# Patient Record
Sex: Female | Born: 1970 | Race: White | Hispanic: No | Marital: Married | Smoking: Never smoker
Health system: Southern US, Community
[De-identification: ages and names within clinical notes are randomized; demographics above are authoritative.]

## PROBLEM LIST (undated history)

## (undated) ENCOUNTER — Emergency Department (HOSPITAL_COMMUNITY): Admission: EM | Discharge status: Against Medical Advice | Payer: Self-pay

---

## 1997-11-10 ENCOUNTER — Other Ambulatory Visit: Admission: RE | Admit: 1997-11-10 | Discharge: 1997-11-10 | Payer: Self-pay | Admitting: *Deleted

## 1998-06-09 ENCOUNTER — Inpatient Hospital Stay (HOSPITAL_COMMUNITY): Admission: AD | Admit: 1998-06-09 | Discharge: 1998-06-09 | Payer: Self-pay | Admitting: Obstetrics and Gynecology

## 1998-06-13 ENCOUNTER — Inpatient Hospital Stay (HOSPITAL_COMMUNITY): Admission: AD | Admit: 1998-06-13 | Discharge: 1998-06-15 | Payer: Self-pay | Admitting: Obstetrics and Gynecology

## 1998-07-21 ENCOUNTER — Other Ambulatory Visit: Admission: RE | Admit: 1998-07-21 | Discharge: 1998-07-21 | Payer: Self-pay | Admitting: Obstetrics and Gynecology

## 1998-11-19 ENCOUNTER — Other Ambulatory Visit: Admission: RE | Admit: 1998-11-19 | Discharge: 1998-11-19 | Payer: Self-pay | Admitting: Obstetrics and Gynecology

## 1999-06-22 ENCOUNTER — Ambulatory Visit (HOSPITAL_COMMUNITY): Admission: RE | Admit: 1999-06-22 | Discharge: 1999-06-22 | Payer: Self-pay | Admitting: Gastroenterology

## 1999-07-28 ENCOUNTER — Other Ambulatory Visit: Admission: RE | Admit: 1999-07-28 | Discharge: 1999-07-28 | Payer: Self-pay | Admitting: Obstetrics and Gynecology

## 2000-08-15 ENCOUNTER — Other Ambulatory Visit: Admission: RE | Admit: 2000-08-15 | Discharge: 2000-08-15 | Payer: Self-pay | Admitting: Obstetrics and Gynecology

## 2001-07-16 ENCOUNTER — Inpatient Hospital Stay (HOSPITAL_COMMUNITY): Admission: AD | Admit: 2001-07-16 | Discharge: 2001-07-18 | Payer: Self-pay | Admitting: Obstetrics and Gynecology

## 2001-07-19 ENCOUNTER — Encounter: Admission: RE | Admit: 2001-07-19 | Discharge: 2001-08-18 | Payer: Self-pay | Admitting: Obstetrics and Gynecology

## 2001-08-23 ENCOUNTER — Other Ambulatory Visit: Admission: RE | Admit: 2001-08-23 | Discharge: 2001-08-23 | Payer: Self-pay | Admitting: Obstetrics and Gynecology

## 2002-09-16 ENCOUNTER — Other Ambulatory Visit: Admission: RE | Admit: 2002-09-16 | Discharge: 2002-09-16 | Payer: Self-pay | Admitting: Obstetrics and Gynecology

## 2003-03-08 ENCOUNTER — Inpatient Hospital Stay (HOSPITAL_COMMUNITY): Admission: AD | Admit: 2003-03-08 | Discharge: 2003-03-08 | Payer: Self-pay | Admitting: Obstetrics and Gynecology

## 2003-03-28 ENCOUNTER — Inpatient Hospital Stay (HOSPITAL_COMMUNITY): Admission: AD | Admit: 2003-03-28 | Discharge: 2003-03-30 | Payer: Self-pay | Admitting: Obstetrics and Gynecology

## 2003-05-06 ENCOUNTER — Other Ambulatory Visit: Admission: RE | Admit: 2003-05-06 | Discharge: 2003-05-06 | Payer: Self-pay | Admitting: Obstetrics and Gynecology

## 2004-05-25 ENCOUNTER — Other Ambulatory Visit: Admission: RE | Admit: 2004-05-25 | Discharge: 2004-05-25 | Payer: Self-pay | Admitting: Obstetrics and Gynecology

## 2005-06-14 ENCOUNTER — Other Ambulatory Visit: Admission: RE | Admit: 2005-06-14 | Discharge: 2005-06-14 | Payer: Self-pay | Admitting: Obstetrics and Gynecology

## 2014-01-10 ENCOUNTER — Telehealth: Payer: Self-pay | Admitting: Neurology

## 2014-01-10 NOTE — Telephone Encounter (Signed)
error °

## 2015-09-15 ENCOUNTER — Other Ambulatory Visit: Payer: Self-pay | Admitting: Obstetrics and Gynecology

## 2015-09-15 DIAGNOSIS — R928 Other abnormal and inconclusive findings on diagnostic imaging of breast: Secondary | ICD-10-CM

## 2015-09-17 ENCOUNTER — Ambulatory Visit
Admission: RE | Admit: 2015-09-17 | Discharge: 2015-09-17 | Disposition: A | Discharge status: Home / Self Care | Payer: Managed Care, Other (non HMO) | Source: Ambulatory Visit | Attending: Obstetrics and Gynecology | Admitting: Obstetrics and Gynecology

## 2015-09-17 DIAGNOSIS — R928 Other abnormal and inconclusive findings on diagnostic imaging of breast: Secondary | ICD-10-CM

## 2015-09-23 ENCOUNTER — Other Ambulatory Visit: Payer: Self-pay

## 2016-08-09 ENCOUNTER — Other Ambulatory Visit: Payer: Self-pay | Admitting: Obstetrics and Gynecology

## 2016-08-09 DIAGNOSIS — Z1231 Encounter for screening mammogram for malignant neoplasm of breast: Secondary | ICD-10-CM

## 2016-09-19 ENCOUNTER — Ambulatory Visit
Admission: RE | Admit: 2016-09-19 | Discharge: 2016-09-19 | Disposition: A | Discharge status: Home / Self Care | Payer: Managed Care, Other (non HMO) | Source: Ambulatory Visit | Attending: Obstetrics and Gynecology | Admitting: Obstetrics and Gynecology

## 2016-09-19 DIAGNOSIS — Z1231 Encounter for screening mammogram for malignant neoplasm of breast: Secondary | ICD-10-CM

## 2016-09-20 ENCOUNTER — Other Ambulatory Visit: Payer: Self-pay | Admitting: Obstetrics and Gynecology

## 2016-09-20 DIAGNOSIS — R928 Other abnormal and inconclusive findings on diagnostic imaging of breast: Secondary | ICD-10-CM

## 2016-09-23 ENCOUNTER — Ambulatory Visit
Admission: RE | Admit: 2016-09-23 | Discharge: 2016-09-23 | Disposition: A | Discharge status: Home / Self Care | Payer: Managed Care, Other (non HMO) | Source: Ambulatory Visit | Attending: Obstetrics and Gynecology | Admitting: Obstetrics and Gynecology

## 2016-09-23 DIAGNOSIS — R928 Other abnormal and inconclusive findings on diagnostic imaging of breast: Secondary | ICD-10-CM

## 2017-01-25 ENCOUNTER — Encounter (INDEPENDENT_AMBULATORY_CARE_PROVIDER_SITE_OTHER): Payer: Self-pay | Admitting: Physician Assistant

## 2017-01-25 ENCOUNTER — Ambulatory Visit (INDEPENDENT_AMBULATORY_CARE_PROVIDER_SITE_OTHER): Payer: Managed Care, Other (non HMO)

## 2017-01-25 ENCOUNTER — Ambulatory Visit (INDEPENDENT_AMBULATORY_CARE_PROVIDER_SITE_OTHER): Payer: Managed Care, Other (non HMO) | Admitting: Physician Assistant

## 2017-01-25 DIAGNOSIS — M7062 Trochanteric bursitis, left hip: Secondary | ICD-10-CM | POA: Diagnosis not present

## 2017-01-25 MED ORDER — LIDOCAINE HCL 1 % IJ SOLN
3.0000 mL | INTRAMUSCULAR | Status: AC | PRN
  Administered 2017-01-25: 3 mL

## 2017-01-25 MED ORDER — METHYLPREDNISOLONE ACETATE 40 MG/ML IJ SUSP
40.0000 mg | INTRAMUSCULAR | Status: AC | PRN
  Administered 2017-01-25: 40 mg via INTRA_ARTICULAR

## 2017-01-25 NOTE — Progress Notes (Signed)
Office Visit Note   Patient: Bonnie Perez           Date of Birth: 12/05/1970           MRN: [id number] Visit Date: 01/25/2017              Requested by: Merri BrunetteSmith, Candace, MD [phone] [address] [place], [place] [id number] PCP: Merri BrunetteSmith, Candace, MD   Assessment & Plan: Visit Diagnoses:  1. Trochanteric bursitis, left hip     Plan: IT band stretching exercises are shown.  She can discuss some other exercises with her friend who is a physical therapist.  She will follow-up with us on an as-needed basis pain persist or becomes worse.  Follow-Up Instructions: Return if symptoms worsen or fail to improve.   Orders:  Orders Placed This Encounter  Procedures  . XR HIP UNILAT W OR W/O PELVIS 1V LEFT   No orders of the defined types were placed in this encounter.     Procedures: Large Joint Inj: L greater trochanter on 01/25/2017 11:27 AM Indications: pain Details: 22 G 1.5 in needle, lateral approach  Arthrogram: No  Medications: 3 mL lidocaine 1 %; 40 mg methylPREDNISolone acetate 40 MG/ML Outcome: tolerated well, no immediate complications Procedure, treatment alternatives, risks and benefits explained, specific risks discussed. Consent was given by the patient. Immediately prior to procedure a time out was called to verify the correct patient, procedure, equipment, support staff and site/side marked as required. Patient was prepped and draped in the usual sterile fashion.       Clinical Data: No additional findings.   Subjective: Chief Complaint  Patient presents with  . Left Hip - Follow-up    HPI Mrs. Bonnie Perez is a 46 year old female comes in today with lateral left hip pain for at least a month maybe longer.  Pain is worse with weightbearing.  Also with lying on her left hip at night.  No waking pain.  She denies any radicular symptoms down either leg.  No back pain.  She has had no injury.  She does run about 5 miles 3 times a week.  She does step  classes also.  She states that the pain in the lateral aspect of the hip is keeping her from doing what she wants to do.  She is talked to a friend who is a physical therapist who recommended she be seen for this.  She is tried some over-the-counter anti-inflammatories without any real results.   Review of Systems Please see HPI otherwise negative  Objective: Vital Signs: There were no vitals taken for this visit.  Physical Exam  Constitutional: She is oriented to person, place, and time. She appears well-developed and well-nourished. No distress.  Pulmonary/Chest: Effort normal.  Neurological: She is alert and oriented to person, place, and time.  Skin: She is not diaphoretic.  Psychiatric: She has a normal mood and affect.    Ortho Exam Bilateral hips good range of motion with internal and external rotation.  Discomfort left hip lateral aspect with extreme internal rotation.  Negative straight leg raise bilaterally.  She has tenderness over left trochanteric region.  Specialty Comments:  No specialty comments available.  Imaging: Xr Hip Unilat W Or W/o Pelvis 1v Left  Result Date: 01/25/2017 Radiographs AP pelvis and lateral view left hip: No acute fractures.  Bilateral hips well maintained.  Hips are well located.  No other bony abnormalities seen.    PMFS History: There are no active problems to display  for this patient.  No past medical history on file.  Family History  Problem Relation Age of Onset  . Breast cancer Maternal Aunt     No past surgical history on file. Social History   Occupational History  . Not on file  Tobacco Use  . Smoking status: Not on file  Substance and Sexual Activity  . Alcohol use: Not on file  . Drug use: Not on file  . Sexual activity: Not on file

## 2017-04-27 ENCOUNTER — Ambulatory Visit (INDEPENDENT_AMBULATORY_CARE_PROVIDER_SITE_OTHER): Payer: Managed Care, Other (non HMO) | Admitting: Sports Medicine

## 2017-04-27 VITALS — BP 135/64 | Ht 65.5 in | Wt 128.0 lb

## 2017-04-27 DIAGNOSIS — M21619 Bunion of unspecified foot: Secondary | ICD-10-CM

## 2017-04-28 ENCOUNTER — Encounter: Payer: Self-pay | Admitting: Sports Medicine

## 2017-04-28 NOTE — Progress Notes (Signed)
   Subjective:    Patient ID: Bonnie LambKathryn Sandberg, female    DOB: [birth year], 47 y.o.   MRN: [id number]  HPI chief complaint: Left foot pain  Olegario MessierKathy is a very pleasant 47 year old female who comes in today complaining of 2 weeks of left foot pain that she localizes to the medial most aspect of her MTP joint. She is currently training for her first half marathon and began to experience pain without any known trauma. Despite her pain, she has been able to continue training. She recently purchased a pair of tennis shoes with a wider toe box and that has decreased her pain. She has not noticed any swelling. She denies pain elsewhere in the foot. She does have a history of 2 previous metatarsal stress fractures but her current pain is different in nature than what she experienced with those injuries. Her pain is most noticeable with direct pressure over the area. She denies erythema. No numbness or tingling. No pain at rest.  Past medical history reviewed Medications reviewed Allergies reviewed    Review of Systems As above    Objective:   Physical Exam  Well-developed, well-nourished. No acute distress. Awake alert and oriented 3. Vital signs reviewed  Left foot: Patient has a moderate bunion deformity. She is tender to palpation over the medial most aspect of the MTP joint and there is a very mild amount of swelling here. No erythema. No pain with active or passive MTP range of motion. No hallux rigidus appreciated. No tenderness to palpation over the sesamoids. No tenderness to palpation across the dorsum of the foot. Negative metatarsal squeeze. No soft tissue swelling on the dorsum of the foot. Good pulses. Sensation is intact to light-touch distally. Patient has a cavus foot with callus formation over the plantar aspect of the first MTP and fifth MTP. 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      Assessment & Plan:   Left foot pain likely secondary to medial first MTP  bursitis Bunion deformity Cavus foot  I think the wider toe box will help her tremendously. I've also recommended that she purchase a bunion sock to help protect her MTP from friction when running. Since she is 3 weeks away from half marathon I do not want to do any sort of orthotic. But, she may benefit from a metatarsal pad at some point down the road. She may also benefit from a custom orthotic given her cavus foot and previous stress fractures. She will follow-up with me at some point after her half marathon to discuss these options further. She is reassured that I do not believe this to be a stress fracture and she may continue to train for her half marathon using pain as her guide.

## 2017-05-23 ENCOUNTER — Encounter: Payer: Self-pay | Admitting: Sports Medicine

## 2017-05-23 ENCOUNTER — Ambulatory Visit (INDEPENDENT_AMBULATORY_CARE_PROVIDER_SITE_OTHER): Payer: Managed Care, Other (non HMO) | Admitting: Sports Medicine

## 2017-05-23 VITALS — BP 114/68 | Ht 65.5 in | Wt 128.0 lb

## 2017-05-23 DIAGNOSIS — Q667 Congenital pes cavus, unspecified foot: Secondary | ICD-10-CM

## 2017-05-23 NOTE — Progress Notes (Signed)
   Subjective:    Patient ID: Bonnie LambKathryn Perez, female    DOB: [birth year], 47 y.o.   MRN: [id number]  HPI   Patient comes in today for follow-up on left foot pain. She was able to complete her half marathon and is happy with her time. She has found the bunion socks to be helpful. She now has minimal pain at the medial aspect of the first MTP joint. She is here today to discuss possible custom orthotics.   Review of Systems As above    Objective:   Physical Exam  Well-developed, well-nourished. No acute distress. Awake alert and oriented 3. Vital signs reviewed  Left foot: Moderate bunion deformity once again seen. Wide forefoot and narrow hindfoot. She is tender to palpation of the medial most aspect of the MTP joint but the previous swelling has resolved. No erythema. Negative metatarsal squeeze. Cavus foot. Neurovascularly intact distally.      Assessment & Plan:   Bunion deformity, left foot Cavus foot History of metatarsal stress fractures  I think the patient would benefit from custom orthotics. Those were constructed for her today. I initially tried placing a metatarsal pad to help give her transverse arch some support but this caused more pain at her bunion. After removing the pad and aggressively grinding down the blue EVA she found the orthotic to be much more comfortable. She will try running with these but if she finds them to be uncomfortable she may discontinue them altogether and return to her regular inserts. Total time spent with the patient today was 30 minutes with greater than 50% of the time spent in face-to-face consultation discussing orthotic construction, instruction, and fitting. Follow-up as needed.  Patient was fitted for a : standard, cushioned, semi-rigid orthotic. The orthotic was heated and afterward the patient stood on the orthotic blank positioned on the orthotic stand. The patient was positioned in subtalar neutral position and 10 degrees of ankle  dorsiflexion in a weight bearing stance. After completion of molding, a stable base was applied to the orthotic blank. The blank was ground to a stable position for weight bearing. Size: 6 Base: Blue EVA Posting: none Additional orthotic padding: none

## 2017-08-29 ENCOUNTER — Other Ambulatory Visit: Payer: Self-pay | Admitting: Family Medicine

## 2017-08-29 DIAGNOSIS — N63 Unspecified lump in unspecified breast: Secondary | ICD-10-CM

## 2017-09-01 ENCOUNTER — Other Ambulatory Visit: Payer: Managed Care, Other (non HMO)

## 2017-09-04 ENCOUNTER — Ambulatory Visit
Admission: RE | Admit: 2017-09-04 | Discharge: 2017-09-04 | Disposition: A | Discharge status: Home / Self Care | Payer: Managed Care, Other (non HMO) | Source: Ambulatory Visit | Attending: Family Medicine | Admitting: Family Medicine

## 2017-09-04 DIAGNOSIS — N63 Unspecified lump in unspecified breast: Secondary | ICD-10-CM

## 2018-07-23 ENCOUNTER — Other Ambulatory Visit: Payer: Self-pay | Admitting: Obstetrics & Gynecology

## 2018-07-23 DIAGNOSIS — Z1231 Encounter for screening mammogram for malignant neoplasm of breast: Secondary | ICD-10-CM

## 2018-09-14 ENCOUNTER — Other Ambulatory Visit: Payer: Self-pay

## 2018-09-14 ENCOUNTER — Ambulatory Visit
Admission: RE | Admit: 2018-09-14 | Discharge: 2018-09-14 | Disposition: A | Discharge status: Home / Self Care | Payer: 59 | Source: Ambulatory Visit | Attending: Obstetrics & Gynecology | Admitting: Obstetrics & Gynecology

## 2018-09-14 DIAGNOSIS — Z1231 Encounter for screening mammogram for malignant neoplasm of breast: Secondary | ICD-10-CM

## 2018-09-18 ENCOUNTER — Other Ambulatory Visit: Payer: Self-pay | Admitting: Obstetrics & Gynecology

## 2018-09-18 DIAGNOSIS — R928 Other abnormal and inconclusive findings on diagnostic imaging of breast: Secondary | ICD-10-CM

## 2018-09-20 ENCOUNTER — Ambulatory Visit
Admission: RE | Admit: 2018-09-20 | Discharge: 2018-09-20 | Disposition: A | Discharge status: Home / Self Care | Payer: 59 | Source: Ambulatory Visit | Attending: Obstetrics & Gynecology | Admitting: Obstetrics & Gynecology

## 2018-09-20 ENCOUNTER — Other Ambulatory Visit: Payer: Self-pay

## 2018-09-20 ENCOUNTER — Other Ambulatory Visit: Payer: Self-pay | Admitting: Obstetrics & Gynecology

## 2018-09-20 DIAGNOSIS — R928 Other abnormal and inconclusive findings on diagnostic imaging of breast: Secondary | ICD-10-CM

## 2018-09-20 DIAGNOSIS — R921 Mammographic calcification found on diagnostic imaging of breast: Secondary | ICD-10-CM

## 2018-09-25 ENCOUNTER — Other Ambulatory Visit: Payer: Self-pay

## 2018-09-25 ENCOUNTER — Ambulatory Visit
Admission: RE | Admit: 2018-09-25 | Discharge: 2018-09-25 | Disposition: A | Discharge status: Home / Self Care | Payer: 59 | Source: Ambulatory Visit | Attending: Obstetrics & Gynecology | Admitting: Obstetrics & Gynecology

## 2018-09-25 DIAGNOSIS — R921 Mammographic calcification found on diagnostic imaging of breast: Secondary | ICD-10-CM

## 2018-09-25 HISTORY — PX: BREAST BIOPSY: SHX20

## 2019-02-02 ENCOUNTER — Other Ambulatory Visit: Payer: Self-pay

## 2019-02-02 DIAGNOSIS — Z20822 Contact with and (suspected) exposure to covid-19: Secondary | ICD-10-CM

## 2019-02-05 LAB — NOVEL CORONAVIRUS, NAA: SARS-CoV-2, NAA: NOT DETECTED

## 2019-04-02 ENCOUNTER — Ambulatory Visit: Payer: 59 | Attending: Internal Medicine

## 2019-04-02 DIAGNOSIS — Z20822 Contact with and (suspected) exposure to covid-19: Secondary | ICD-10-CM

## 2019-04-04 LAB — NOVEL CORONAVIRUS, NAA: SARS-CoV-2, NAA: NOT DETECTED

## 2019-07-24 ENCOUNTER — Other Ambulatory Visit: Payer: Self-pay | Admitting: Obstetrics & Gynecology

## 2019-07-24 DIAGNOSIS — Z1231 Encounter for screening mammogram for malignant neoplasm of breast: Secondary | ICD-10-CM

## 2019-09-17 ENCOUNTER — Other Ambulatory Visit: Payer: Self-pay

## 2019-09-17 ENCOUNTER — Ambulatory Visit
Admission: RE | Admit: 2019-09-17 | Discharge: 2019-09-17 | Disposition: A | Discharge status: Home / Self Care | Payer: BC Managed Care – PPO | Source: Ambulatory Visit | Attending: Obstetrics & Gynecology | Admitting: Obstetrics & Gynecology

## 2019-09-17 DIAGNOSIS — Z1231 Encounter for screening mammogram for malignant neoplasm of breast: Secondary | ICD-10-CM | POA: Diagnosis not present

## 2019-12-03 DIAGNOSIS — D225 Melanocytic nevi of trunk: Secondary | ICD-10-CM | POA: Diagnosis not present

## 2019-12-03 DIAGNOSIS — L821 Other seborrheic keratosis: Secondary | ICD-10-CM | POA: Diagnosis not present

## 2019-12-03 DIAGNOSIS — D2261 Melanocytic nevi of right upper limb, including shoulder: Secondary | ICD-10-CM | POA: Diagnosis not present

## 2019-12-03 DIAGNOSIS — L814 Other melanin hyperpigmentation: Secondary | ICD-10-CM | POA: Diagnosis not present

## 2019-12-10 DIAGNOSIS — Z1159 Encounter for screening for other viral diseases: Secondary | ICD-10-CM | POA: Diagnosis not present

## 2019-12-10 DIAGNOSIS — Z0000 Encounter for general adult medical examination without abnormal findings: Secondary | ICD-10-CM | POA: Diagnosis not present

## 2019-12-17 DIAGNOSIS — Z23 Encounter for immunization: Secondary | ICD-10-CM | POA: Diagnosis not present

## 2019-12-17 DIAGNOSIS — Z0000 Encounter for general adult medical examination without abnormal findings: Secondary | ICD-10-CM | POA: Diagnosis not present

## 2019-12-17 DIAGNOSIS — Z1211 Encounter for screening for malignant neoplasm of colon: Secondary | ICD-10-CM | POA: Diagnosis not present

## 2020-03-14 IMAGING — MG STEREOTACTIC CORE NEEDLE BIOPSY
8 of 11 series · 8 of 23 positions shown · non-contrast
Comparison: Previous exams.
COMPARISON: Previous exams.

Addendum:
CLINICAL DATA: 48-year-old female presenting for stereotactic
biopsy of left breast calcifications.

EXAM:
LEFT BREAST STEREOTACTIC CORE NEEDLE BIOPSY

[L (1 of 6)]
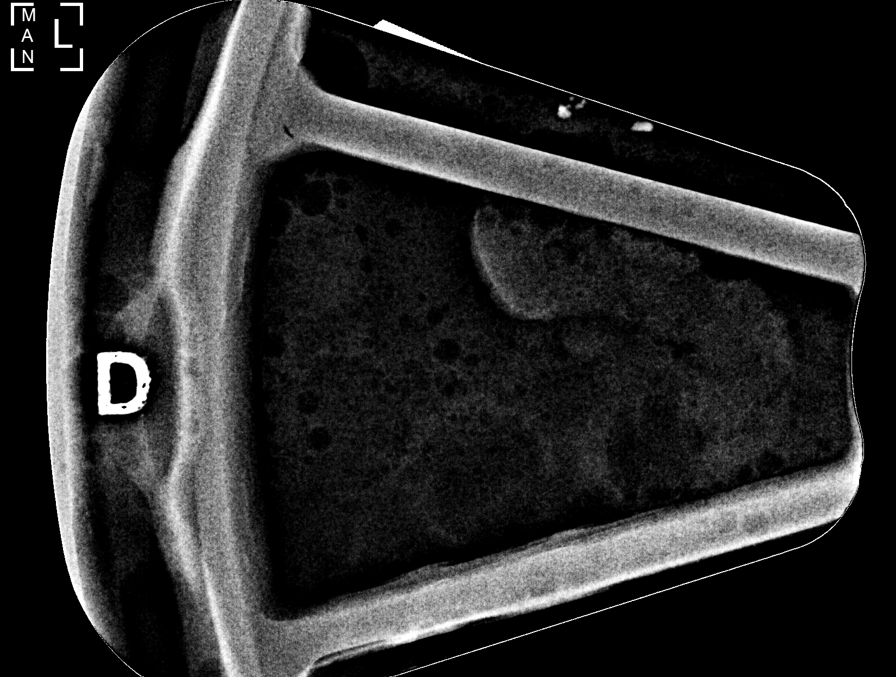

[L (2 of 6)]
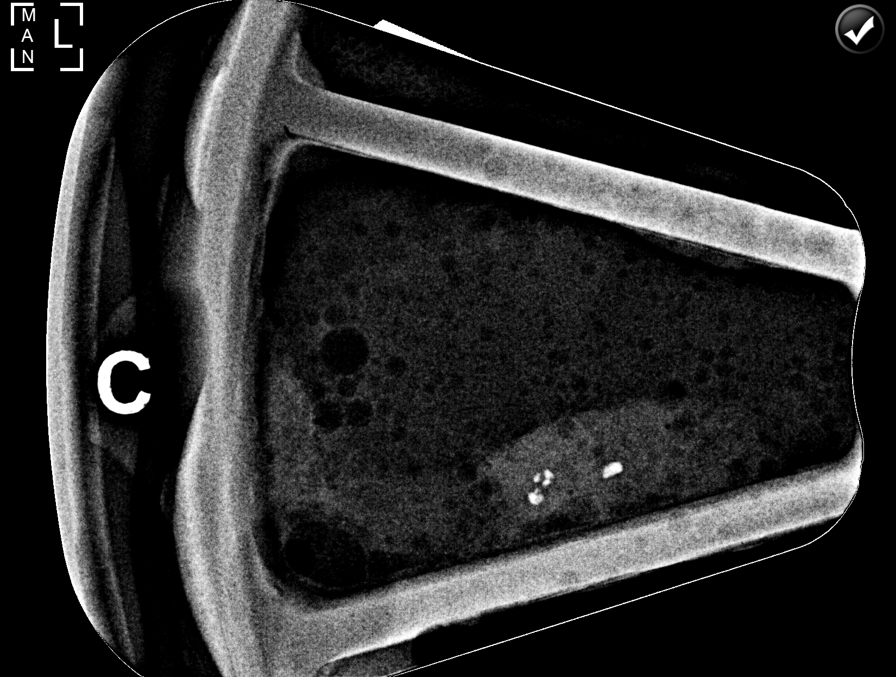

[L (3 of 6)]
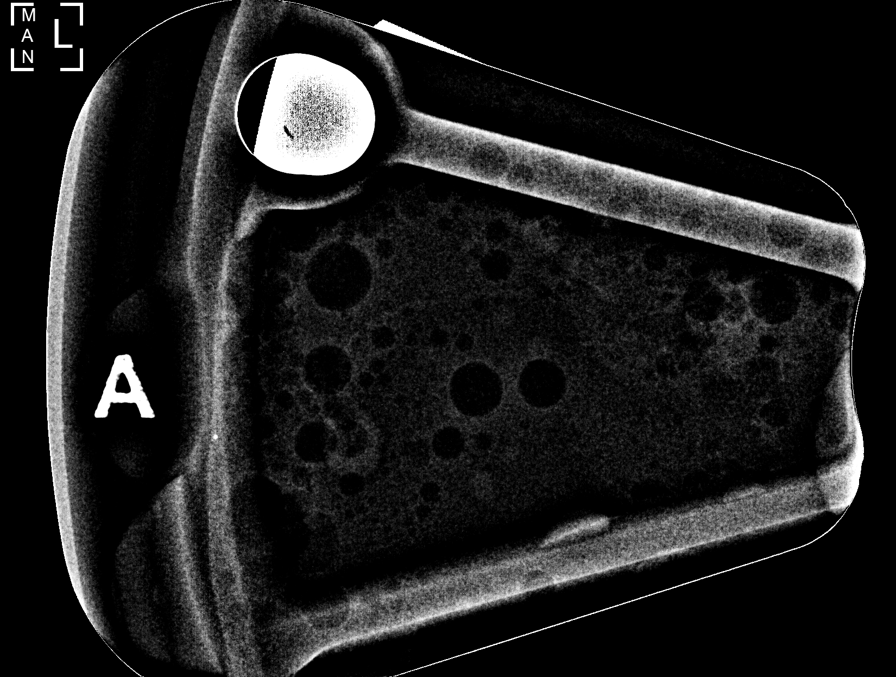

[L (4 of 6)]
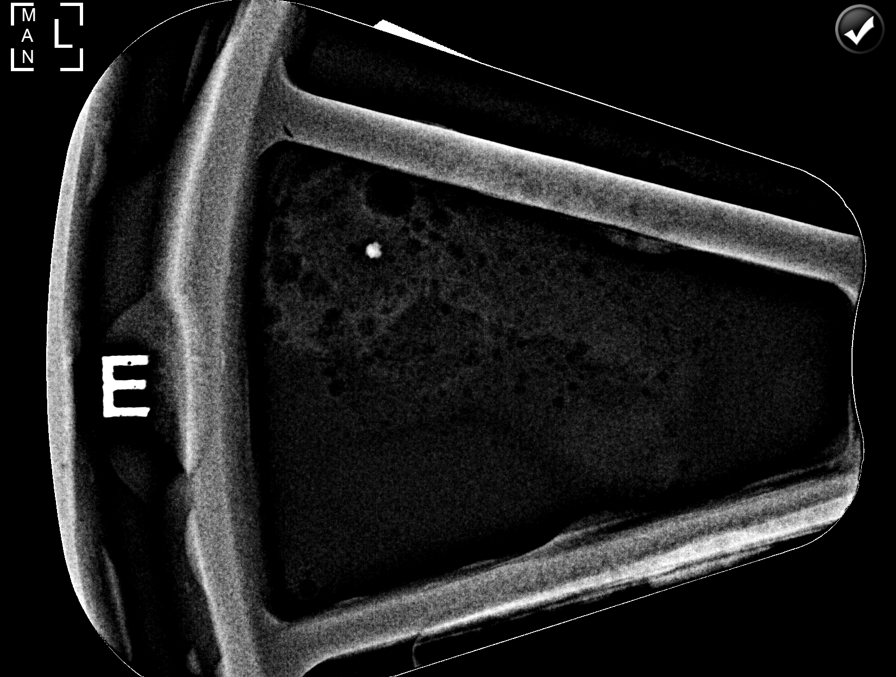

[L (5 of 6)]
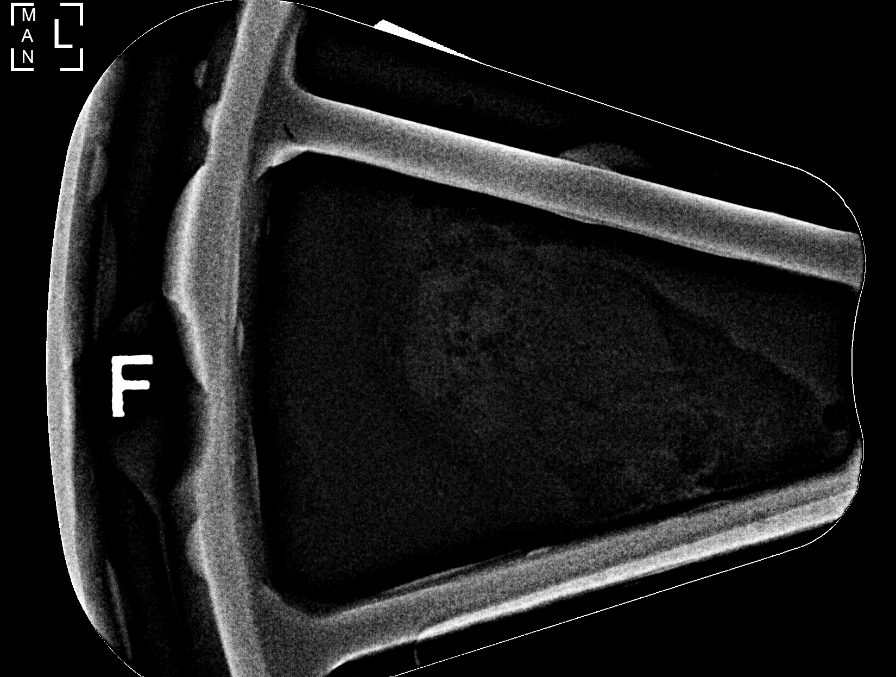

[L (6 of 6)]
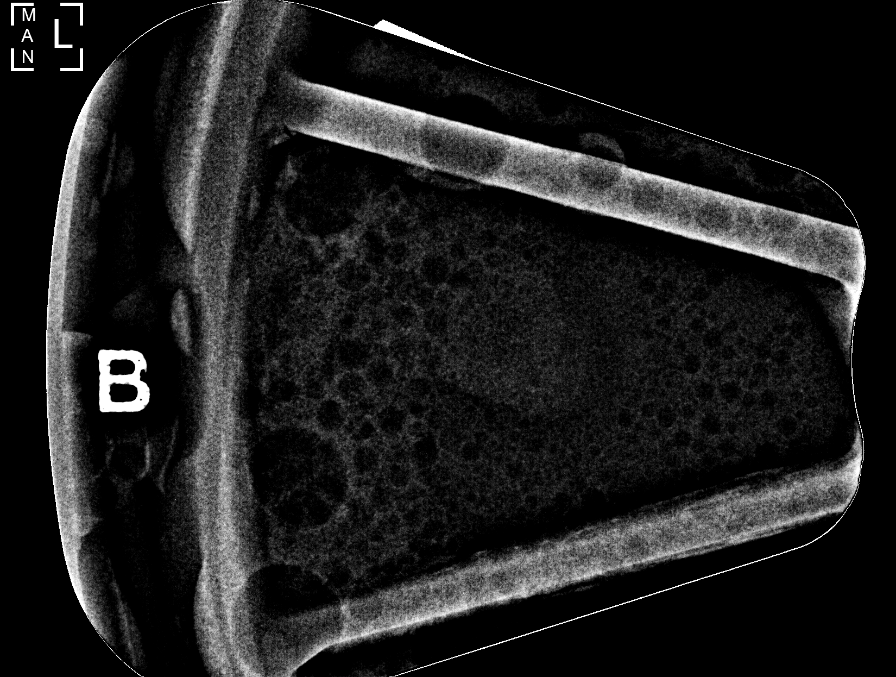

[L CC (1 of 2)]
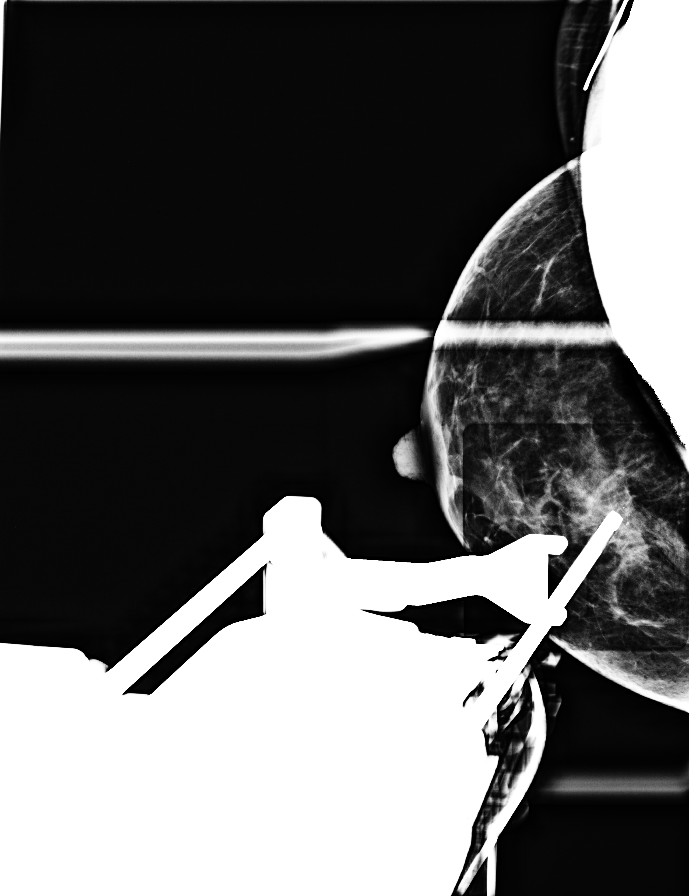

[L CC (2 of 2)]
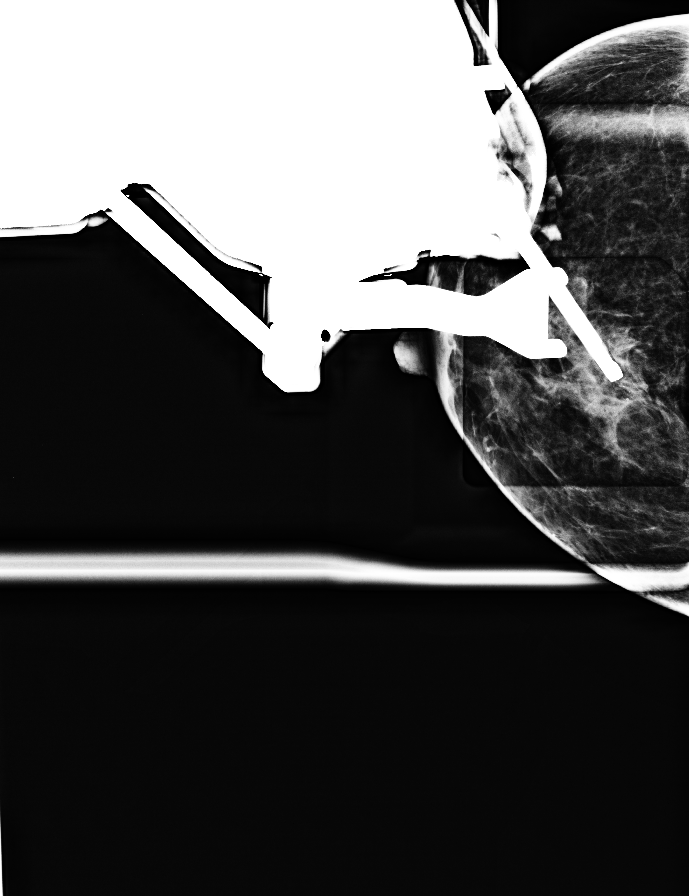

[8 of 23 positions shown; findings below may reference images not displayed]



Using sterile technique and 1% Lidocaine as local anesthetic, under
stereotactic guidance, a 9 gauge vacuum assisted device was used to
perform core needle biopsy of calcifications in the upper outer
quadrant of the left breast using a superior approach. Specimen
radiograph was performed showing calcifications in 2 of 6 core
samples. Specimens with calcifications are identified for pathology.

Lesion quadrant: Upper-outer quadrant

At the conclusion of the procedure, a coil shaped tissue marker clip
was deployed into the biopsy cavity. Follow-up 2-view mammogram was
performed and dictated separately.
IMPRESSION: Stereotactic-guided biopsy of calcifications in the upper-outer left
breast. No apparent complications.

ADDENDUM:
Pathology revealed FIBROCYSTIC CHANGE, INCLUDING FIBROADENOMATOID
CHANGE AND CALCIFICATIONS of the LEFT breast, upper outer quadrant.
NEGATIVE FOR CARCINOMA . This was found to be concordant by Dr.
Mirash Aguas.

Pathology results were discussed with the patient by telephone. The
patient reported doing well after the biopsy with tenderness at the
site. Post biopsy instructions and care were reviewed and questions
were answered. The patient was encouraged to call The [REDACTED]

The patient was instructed to return for annual screening
mammography.

Pathology results reported by Zeinab Tiger, RN on 09/26/2018.



Using sterile technique and 1% Lidocaine as local anesthetic, under
stereotactic guidance, a 9 gauge vacuum assisted device was used to
perform core needle biopsy of calcifications in the upper outer
quadrant of the left breast using a superior approach. Specimen
radiograph was performed showing calcifications in 2 of 6 core
samples. Specimens with calcifications are identified for pathology.

Lesion quadrant: Upper-outer quadrant

At the conclusion of the procedure, a coil shaped tissue marker clip
was deployed into the biopsy cavity. Follow-up 2-view mammogram was
performed and dictated separately.
IMPRESSION: Stereotactic-guided biopsy of calcifications in the upper-outer left
breast. No apparent complications.

## 2020-07-28 ENCOUNTER — Other Ambulatory Visit: Payer: Self-pay | Admitting: Obstetrics & Gynecology

## 2020-07-28 DIAGNOSIS — Z1231 Encounter for screening mammogram for malignant neoplasm of breast: Secondary | ICD-10-CM

## 2020-07-30 DIAGNOSIS — L900 Lichen sclerosus et atrophicus: Secondary | ICD-10-CM | POA: Diagnosis not present

## 2020-09-02 DIAGNOSIS — N952 Postmenopausal atrophic vaginitis: Secondary | ICD-10-CM | POA: Diagnosis not present

## 2020-09-08 DIAGNOSIS — Z1211 Encounter for screening for malignant neoplasm of colon: Secondary | ICD-10-CM | POA: Diagnosis not present

## 2020-09-22 ENCOUNTER — Other Ambulatory Visit: Payer: Self-pay

## 2020-09-22 ENCOUNTER — Ambulatory Visit
Admission: RE | Admit: 2020-09-22 | Discharge: 2020-09-22 | Disposition: A | Discharge status: Home / Self Care | Payer: BC Managed Care – PPO | Source: Ambulatory Visit

## 2020-09-22 DIAGNOSIS — Z1231 Encounter for screening mammogram for malignant neoplasm of breast: Secondary | ICD-10-CM | POA: Diagnosis not present

## 2020-10-21 DIAGNOSIS — N959 Unspecified menopausal and perimenopausal disorder: Secondary | ICD-10-CM | POA: Diagnosis not present

## 2020-10-21 DIAGNOSIS — Z6822 Body mass index (BMI) 22.0-22.9, adult: Secondary | ICD-10-CM | POA: Diagnosis not present

## 2020-10-21 DIAGNOSIS — N393 Stress incontinence (female) (male): Secondary | ICD-10-CM | POA: Diagnosis not present

## 2020-10-21 DIAGNOSIS — Z01419 Encounter for gynecological examination (general) (routine) without abnormal findings: Secondary | ICD-10-CM | POA: Diagnosis not present

## 2020-10-27 ENCOUNTER — Other Ambulatory Visit: Payer: Self-pay

## 2020-10-27 ENCOUNTER — Ambulatory Visit (INDEPENDENT_AMBULATORY_CARE_PROVIDER_SITE_OTHER): Payer: BC Managed Care – PPO | Admitting: Sports Medicine

## 2020-10-27 VITALS — BP 104/72 | Ht 65.5 in | Wt 138.0 lb

## 2020-10-27 DIAGNOSIS — M7711 Lateral epicondylitis, right elbow: Secondary | ICD-10-CM | POA: Diagnosis not present

## 2020-10-27 MED ORDER — MELOXICAM 15 MG PO TABS: ORAL_TABLET | ORAL | 0 refills | Status: DC

## 2020-10-28 NOTE — Progress Notes (Signed)
   Subjective:    Patient ID: Bonnie Perez, female    DOB: 09/08/1970, 50 y.o.   MRN: [id number]  HPI chief complaint: Right elbow pain  Bonnie Perez is a very pleasant ambidextrous 50 year old female that comes in today complaining of 5 weeks of right elbow pain.  She denies any trauma but describes a rather acute onset of pain that began after an exercise class.  She localizes all of her pain to the lateral elbow.  Pain is most noticeable with direct pressure over the area.  Also with lifting heavy objects.  She is having difficulty sleeping at night due to the pain.  She has not tried any specific treatment to date.  Denies similar problems in the past.  Pain does not radiate.  No numbness or tingling.  Past medical history reviewed Medications reviewed Allergies reviewed    Review of Systems As above    Objective:   Physical Exam Well-developed, well-nourished.  No acute distress  Right elbow: Full range of motion.  No effusion.  No soft tissue swelling.  There is tenderness to palpation directly over the lateral epicondyle and reproducible pain with resisted ECRB testing.  No tenderness over the medial epicondyle good pulses distally.  Good grip.       Assessment & Plan:   Right elbow pain secondary to lateral epicondylitis  Meloxicam 15 mg daily for 7 days then as needed.  Counterforce brace to be worn with activity until symptoms improve.  Home exercise program was provided including stretches and eccentric strengthening.  She will ice the elbow daily utilizing an ice massage.  She will avoid any sort of exercise that causes pain.  If symptoms do not start to improve in the next week or 2 then consider formal physical therapy.  She will follow-up for ongoing or recalcitrant issues.

## 2020-11-17 ENCOUNTER — Other Ambulatory Visit: Payer: Self-pay

## 2020-11-17 DIAGNOSIS — M7711 Lateral epicondylitis, right elbow: Secondary | ICD-10-CM

## 2020-11-17 NOTE — Progress Notes (Signed)
Pt called asking for PT order for Rt elbow pain. Would like to go to [facility].   Referral sent.

## 2020-11-19 DIAGNOSIS — M25521 Pain in right elbow: Secondary | ICD-10-CM | POA: Diagnosis not present

## 2020-11-25 DIAGNOSIS — M25521 Pain in right elbow: Secondary | ICD-10-CM | POA: Diagnosis not present

## 2020-11-26 ENCOUNTER — Other Ambulatory Visit: Payer: Self-pay

## 2020-11-26 MED ORDER — AMBULATORY NON FORMULARY MEDICATION: 0 refills | Status: AC

## 2020-11-30 DIAGNOSIS — M25521 Pain in right elbow: Secondary | ICD-10-CM | POA: Diagnosis not present

## 2020-12-07 DIAGNOSIS — M25521 Pain in right elbow: Secondary | ICD-10-CM | POA: Diagnosis not present

## 2020-12-10 DIAGNOSIS — M25521 Pain in right elbow: Secondary | ICD-10-CM | POA: Diagnosis not present

## 2020-12-14 DIAGNOSIS — M25521 Pain in right elbow: Secondary | ICD-10-CM | POA: Diagnosis not present

## 2020-12-15 DIAGNOSIS — D2272 Melanocytic nevi of left lower limb, including hip: Secondary | ICD-10-CM | POA: Diagnosis not present

## 2020-12-15 DIAGNOSIS — D2261 Melanocytic nevi of right upper limb, including shoulder: Secondary | ICD-10-CM | POA: Diagnosis not present

## 2020-12-15 DIAGNOSIS — L853 Xerosis cutis: Secondary | ICD-10-CM | POA: Diagnosis not present

## 2020-12-15 DIAGNOSIS — D2271 Melanocytic nevi of right lower limb, including hip: Secondary | ICD-10-CM | POA: Diagnosis not present

## 2020-12-17 DIAGNOSIS — Z0000 Encounter for general adult medical examination without abnormal findings: Secondary | ICD-10-CM | POA: Diagnosis not present

## 2020-12-17 DIAGNOSIS — M25521 Pain in right elbow: Secondary | ICD-10-CM | POA: Diagnosis not present

## 2020-12-21 DIAGNOSIS — M25521 Pain in right elbow: Secondary | ICD-10-CM | POA: Diagnosis not present

## 2020-12-22 DIAGNOSIS — Z23 Encounter for immunization: Secondary | ICD-10-CM | POA: Diagnosis not present

## 2020-12-22 DIAGNOSIS — Z0000 Encounter for general adult medical examination without abnormal findings: Secondary | ICD-10-CM | POA: Diagnosis not present

## 2020-12-24 DIAGNOSIS — M25521 Pain in right elbow: Secondary | ICD-10-CM | POA: Diagnosis not present

## 2020-12-30 DIAGNOSIS — M25521 Pain in right elbow: Secondary | ICD-10-CM | POA: Diagnosis not present

## 2021-01-04 DIAGNOSIS — M25521 Pain in right elbow: Secondary | ICD-10-CM | POA: Diagnosis not present

## 2021-01-07 DIAGNOSIS — M25521 Pain in right elbow: Secondary | ICD-10-CM | POA: Diagnosis not present

## 2021-01-11 DIAGNOSIS — M25629 Stiffness of unspecified elbow, not elsewhere classified: Secondary | ICD-10-CM | POA: Diagnosis not present

## 2021-01-13 DIAGNOSIS — M25521 Pain in right elbow: Secondary | ICD-10-CM | POA: Diagnosis not present

## 2021-01-20 DIAGNOSIS — M25629 Stiffness of unspecified elbow, not elsewhere classified: Secondary | ICD-10-CM | POA: Diagnosis not present

## 2021-01-27 DIAGNOSIS — M25629 Stiffness of unspecified elbow, not elsewhere classified: Secondary | ICD-10-CM | POA: Diagnosis not present

## 2021-03-26 DIAGNOSIS — E782 Mixed hyperlipidemia: Secondary | ICD-10-CM | POA: Diagnosis not present

## 2021-03-31 DIAGNOSIS — Z23 Encounter for immunization: Secondary | ICD-10-CM | POA: Diagnosis not present

## 2021-03-31 DIAGNOSIS — F411 Generalized anxiety disorder: Secondary | ICD-10-CM | POA: Diagnosis not present

## 2021-03-31 DIAGNOSIS — F331 Major depressive disorder, recurrent, moderate: Secondary | ICD-10-CM | POA: Diagnosis not present

## 2021-03-31 DIAGNOSIS — G4700 Insomnia, unspecified: Secondary | ICD-10-CM | POA: Diagnosis not present

## 2021-03-31 DIAGNOSIS — E782 Mixed hyperlipidemia: Secondary | ICD-10-CM | POA: Diagnosis not present

## 2021-04-30 DIAGNOSIS — M6289 Other specified disorders of muscle: Secondary | ICD-10-CM | POA: Diagnosis not present

## 2021-04-30 DIAGNOSIS — N393 Stress incontinence (female) (male): Secondary | ICD-10-CM | POA: Diagnosis not present

## 2021-04-30 DIAGNOSIS — M6281 Muscle weakness (generalized): Secondary | ICD-10-CM | POA: Diagnosis not present

## 2021-05-25 DIAGNOSIS — M6281 Muscle weakness (generalized): Secondary | ICD-10-CM | POA: Diagnosis not present

## 2021-05-25 DIAGNOSIS — N393 Stress incontinence (female) (male): Secondary | ICD-10-CM | POA: Diagnosis not present

## 2021-05-25 DIAGNOSIS — M6289 Other specified disorders of muscle: Secondary | ICD-10-CM | POA: Diagnosis not present

## 2021-06-17 ENCOUNTER — Other Ambulatory Visit: Payer: Self-pay | Admitting: Nurse Practitioner

## 2021-06-17 DIAGNOSIS — M6289 Other specified disorders of muscle: Secondary | ICD-10-CM | POA: Diagnosis not present

## 2021-06-17 DIAGNOSIS — Z8249 Family history of ischemic heart disease and other diseases of the circulatory system: Secondary | ICD-10-CM | POA: Diagnosis not present

## 2021-06-17 DIAGNOSIS — N393 Stress incontinence (female) (male): Secondary | ICD-10-CM | POA: Diagnosis not present

## 2021-06-17 DIAGNOSIS — M6281 Muscle weakness (generalized): Secondary | ICD-10-CM | POA: Diagnosis not present

## 2021-06-17 DIAGNOSIS — E782 Mixed hyperlipidemia: Secondary | ICD-10-CM | POA: Diagnosis not present

## 2021-06-18 ENCOUNTER — Other Ambulatory Visit: Payer: Self-pay | Admitting: Nurse Practitioner

## 2021-06-18 DIAGNOSIS — Z8249 Family history of ischemic heart disease and other diseases of the circulatory system: Secondary | ICD-10-CM

## 2021-06-18 DIAGNOSIS — Z8639 Personal history of other endocrine, nutritional and metabolic disease: Secondary | ICD-10-CM

## 2021-06-25 ENCOUNTER — Other Ambulatory Visit: Payer: BC Managed Care – PPO

## 2021-06-25 ENCOUNTER — Ambulatory Visit
Admission: RE | Admit: 2021-06-25 | Discharge: 2021-06-25 | Disposition: A | Discharge status: Home / Self Care | Payer: No Typology Code available for payment source | Source: Ambulatory Visit | Attending: Nurse Practitioner | Admitting: Nurse Practitioner

## 2021-06-25 DIAGNOSIS — Z8249 Family history of ischemic heart disease and other diseases of the circulatory system: Secondary | ICD-10-CM

## 2021-06-25 DIAGNOSIS — Z8639 Personal history of other endocrine, nutritional and metabolic disease: Secondary | ICD-10-CM

## 2021-06-30 ENCOUNTER — Ambulatory Visit (INDEPENDENT_AMBULATORY_CARE_PROVIDER_SITE_OTHER): Payer: BC Managed Care – PPO | Admitting: Orthopaedic Surgery

## 2021-06-30 DIAGNOSIS — M7061 Trochanteric bursitis, right hip: Secondary | ICD-10-CM

## 2021-06-30 DIAGNOSIS — M25551 Pain in right hip: Secondary | ICD-10-CM

## 2021-06-30 MED ORDER — LIDOCAINE HCL 1 % IJ SOLN
3.0000 mL | INTRAMUSCULAR | Status: AC | PRN
  Administered 2021-06-30: 3 mL

## 2021-06-30 MED ORDER — METHYLPREDNISOLONE ACETATE 40 MG/ML IJ SUSP
40.0000 mg | INTRAMUSCULAR | Status: AC | PRN
  Administered 2021-06-30: 40 mg via INTRA_ARTICULAR

## 2021-06-30 NOTE — Progress Notes (Signed)
? ?Office Visit Note ?  ?Patient: Bonnie Perez           ?Date of Birth: 11/11/1970           ?MRN: [id number] ?Visit Date: 06/30/2021 ?             ?Requested by: Lewis Moccasin, MD ?[address],  [place] [address] ?PCP: Lewis Moccasin, MD ? ? ?Assessment & Plan: ?Visit Diagnoses:  ?1. Pain in right hip   ?2. Trochanteric bursitis, right hip   ? ? ?Plan: I did try to find the point of maximum pain over the trochanteric area and provided a steroid injection in this area.  She will also try Voltaren gel.  She knows to call us if this is not getting better because my next step would be sending her to outpatient physical therapy.  All questions and concerns were answered and addressed. ? ?Follow-Up Instructions: Return if symptoms worsen or fail to improve.  ? ?Orders:  ?Orders Placed This Encounter  ?Procedures  ? Large Joint Inj  ? ?No orders of the defined types were placed in this encounter. ? ? ? ? Procedures: ?Large Joint Inj: R greater trochanter on 06/30/2021 2:11 PM ?Indications: pain and diagnostic evaluation ?Details: 22 G 1.5 in needle, lateral approach ? ?Arthrogram: No ? ?Medications: 3 mL lidocaine 1 %; 40 mg methylPREDNISolone acetate 40 MG/ML ?Outcome: tolerated well, no immediate complications ?Procedure, treatment alternatives, risks and benefits explained, specific risks discussed. Consent was given by the patient. Immediately prior to procedure a time out was called to verify the correct patient, procedure, equipment, support staff and site/side marked as required. Patient was prepped and draped in the usual sterile fashion.  ? ? ? ? ?Clinical Data: ?No additional findings. ? ? ?Subjective: ?Chief Complaint  ?Patient presents with  ? Right Hip - Pain  ?The patient is a very pleasant 51 year old active female who comes in with right hip pain has been hurting for about a month now.  She has remote history of trochanteric bursitis on the left side that was helped by an  injection.  She is a runner but has not changed her running routine.  She has no known injury.  She is a side sleeper so it does wake her up at night.  She denies any groin pain.  She is a thin individual and not a diabetic. ? ?HPI ? ?Review of Systems ?There is no listed fever, chills, nausea, vomiting ? ?Objective: ?Vital Signs: There were no vitals taken for this visit. ? ?Physical Exam ?She is alert and orient x3 and in no acute distress ?Ortho Exam ?Examination of her right and left hip shows full range of motion with no pain in the groin at all.  Her right hip hurts over the tip of the trochanteric area and some over the tensor fascia.  Again there is no groin pain. ?Specialty Comments:  ?No specialty comments available. ? ?Imaging: ?No results found. ? ? ?PMFS History: ?There are no problems to display for this patient. ? ?No past medical history on file.  ?Family History  ?Problem Relation Age of Onset  ? Breast cancer Maternal Aunt   ?  ?No past surgical history on file. ?Social History  ? ?Occupational History  ? Not on file  ?Tobacco Use  ? Smoking status: Never  ? Smokeless tobacco: Never  ?Substance and Sexual Activity  ? Alcohol use: Not on file  ? Drug use: Not on file  ?  Sexual activity: Not on file  ? ? ? ? ? ? ?

## 2021-07-02 DIAGNOSIS — H0015 Chalazion left lower eyelid: Secondary | ICD-10-CM | POA: Diagnosis not present

## 2021-08-10 ENCOUNTER — Other Ambulatory Visit: Payer: Self-pay | Admitting: Obstetrics & Gynecology

## 2021-08-10 DIAGNOSIS — Z1231 Encounter for screening mammogram for malignant neoplasm of breast: Secondary | ICD-10-CM

## 2021-08-12 DIAGNOSIS — E782 Mixed hyperlipidemia: Secondary | ICD-10-CM | POA: Diagnosis not present

## 2021-08-18 DIAGNOSIS — E782 Mixed hyperlipidemia: Secondary | ICD-10-CM | POA: Diagnosis not present

## 2021-08-18 DIAGNOSIS — Z8249 Family history of ischemic heart disease and other diseases of the circulatory system: Secondary | ICD-10-CM | POA: Diagnosis not present

## 2021-08-18 DIAGNOSIS — E559 Vitamin D deficiency, unspecified: Secondary | ICD-10-CM | POA: Diagnosis not present

## 2021-09-28 ENCOUNTER — Ambulatory Visit
Admission: RE | Admit: 2021-09-28 | Discharge: 2021-09-28 | Disposition: A | Discharge status: Home / Self Care | Payer: BC Managed Care – PPO | Source: Ambulatory Visit | Attending: Obstetrics & Gynecology | Admitting: Obstetrics & Gynecology

## 2021-09-28 DIAGNOSIS — Z1231 Encounter for screening mammogram for malignant neoplasm of breast: Secondary | ICD-10-CM | POA: Diagnosis not present

## 2021-11-10 DIAGNOSIS — Z6821 Body mass index (BMI) 21.0-21.9, adult: Secondary | ICD-10-CM | POA: Diagnosis not present

## 2021-11-10 DIAGNOSIS — Z01419 Encounter for gynecological examination (general) (routine) without abnormal findings: Secondary | ICD-10-CM | POA: Diagnosis not present

## 2021-12-29 DIAGNOSIS — E7800 Pure hypercholesterolemia, unspecified: Secondary | ICD-10-CM | POA: Diagnosis not present

## 2021-12-29 DIAGNOSIS — Z79899 Other long term (current) drug therapy: Secondary | ICD-10-CM | POA: Diagnosis not present

## 2021-12-29 DIAGNOSIS — Z0000 Encounter for general adult medical examination without abnormal findings: Secondary | ICD-10-CM | POA: Diagnosis not present

## 2021-12-29 DIAGNOSIS — Z23 Encounter for immunization: Secondary | ICD-10-CM | POA: Diagnosis not present

## 2021-12-29 DIAGNOSIS — F419 Anxiety disorder, unspecified: Secondary | ICD-10-CM | POA: Diagnosis not present

## 2022-02-01 DIAGNOSIS — L821 Other seborrheic keratosis: Secondary | ICD-10-CM | POA: Diagnosis not present

## 2022-02-01 DIAGNOSIS — L720 Epidermal cyst: Secondary | ICD-10-CM | POA: Diagnosis not present

## 2022-02-01 DIAGNOSIS — D2261 Melanocytic nevi of right upper limb, including shoulder: Secondary | ICD-10-CM | POA: Diagnosis not present

## 2022-02-01 DIAGNOSIS — D2262 Melanocytic nevi of left upper limb, including shoulder: Secondary | ICD-10-CM | POA: Diagnosis not present

## 2022-03-08 ENCOUNTER — Ambulatory Visit (INDEPENDENT_AMBULATORY_CARE_PROVIDER_SITE_OTHER): Payer: BC Managed Care – PPO

## 2022-03-08 ENCOUNTER — Ambulatory Visit (INDEPENDENT_AMBULATORY_CARE_PROVIDER_SITE_OTHER): Payer: BC Managed Care – PPO | Admitting: Orthopaedic Surgery

## 2022-03-08 ENCOUNTER — Encounter: Payer: Self-pay | Admitting: Orthopaedic Surgery

## 2022-03-08 DIAGNOSIS — G8929 Other chronic pain: Secondary | ICD-10-CM

## 2022-03-08 DIAGNOSIS — M25561 Pain in right knee: Secondary | ICD-10-CM

## 2022-03-08 MED ORDER — DICLOFENAC SODIUM 75 MG PO TBEC: 75.0000 mg | DELAYED_RELEASE_TABLET | Freq: Two times a day (BID) | ORAL | 2 refills | Status: AC

## 2022-03-08 NOTE — Progress Notes (Signed)
Office Visit Note   Patient: Bonnie Perez           Date of Birth: 16-Jun-1970           MRN: [id number] Visit Date: 03/08/2022              Requested by: Bonnie Moccasin, MD [address] [place],  [place] [address] PCP: Bonnie Moccasin, MD   Assessment & Plan: Visit Diagnoses:  1. Chronic pain of right knee     Plan: Impression is distal IT band friction syndrome.  Not getting a sense that she has a meniscal injury although I am not ruling it out.  Based on initial impressions I have recommended a course of relative rest, 2 weeks of oral diclofenac.  She has topical diclofenac at home that she will try.  If symptoms persist we can obtain an MRI to rule out structural abnormalities.  Follow-Up Instructions: No follow-ups on file.   Orders:  Orders Placed This Encounter  Procedures   XR KNEE 3 VIEW RIGHT   Meds ordered this encounter  Medications   diclofenac (VOLTAREN) 75 MG EC tablet    Sig: Take 1 tablet (75 mg total) by mouth 2 (two) times daily.    Dispense:  30 tablet    Refill:  2      Procedures: No procedures performed   Clinical Data: No additional findings.   Subjective: Chief Complaint  Patient presents with   Right Knee - Pain    HPI Bonnie Perez is a 51 year old female who is here for evaluation of lateral right knee pain for about a month.  Denies any injuries.  Runs twice a week and was able to run 5 miles yesterday without any pain other than when she first started running.  She has pain when she is sitting and with her knees bent at night when she sleeping.  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  Denies any mechanical symptoms or swelling or grinding.  She has taken Advil which helps temporarily.  Review of Systems  Constitutional: Negative.   HENT: Negative.    Eyes: Negative.   Respiratory: Negative.    Cardiovascular: Negative.   Endocrine: Negative.   Musculoskeletal: Negative.   Neurological: Negative.   Hematological: Negative.    Psychiatric/Behavioral: Negative.    All other systems reviewed and are negative.    Objective: Vital Signs: There were no vitals taken for this visit.  Physical Exam Vitals and nursing note reviewed.  Constitutional:      Appearance: She is well-developed.  HENT:     Head: Atraumatic.     Nose: Nose normal.  Eyes:     Extraocular Movements: Extraocular movements intact.  Cardiovascular:     Pulses: Normal pulses.  Pulmonary:     Effort: Pulmonary effort is normal.  Abdominal:     Palpations: Abdomen is soft.  Musculoskeletal:     Cervical back: Neck supple.  Skin:    General: Skin is warm.     Capillary Refill: Capillary refill takes less than 2 seconds.  Neurological:     Mental Status: She is alert. Mental status is at baseline.  Psychiatric:        Behavior: Behavior normal.        Thought Content: Thought content normal.        Judgment: Judgment normal.     Ortho Exam Examination right knee shows tenderness near the region of the IT band over the lateral femoral condyle.  I  do not feel any crepitus or grinding.  No lateral joint line tenderness.  No joint effusion.  Normal range of motion without pain.  Normal dial test.  Collaterals and cruciates are stable.  Specialty Comments:  No specialty comments available.  Imaging: XR KNEE 3 VIEW RIGHT  Result Date: 03/08/2022 No acute or structural abnormalities    PMFS History: There are no problems to display for this patient.  History reviewed. No pertinent past medical history.  Family History  Problem Relation Age of Onset   Breast cancer Maternal Aunt     Past Surgical History:  Procedure Laterality Date   BREAST BIOPSY Left 09/25/2018   Social History   Occupational History   Not on file  Tobacco Use   Smoking status: Never   Smokeless tobacco: Never  Substance and Sexual Activity   Alcohol use: Not on file   Drug use: Not on file   Sexual activity: Not on file

## 2022-08-11 ENCOUNTER — Other Ambulatory Visit: Payer: Self-pay | Admitting: Obstetrics & Gynecology

## 2022-08-11 DIAGNOSIS — Z0000 Encounter for general adult medical examination without abnormal findings: Secondary | ICD-10-CM

## 2022-09-30 ENCOUNTER — Ambulatory Visit
Admission: RE | Admit: 2022-09-30 | Discharge: 2022-09-30 | Disposition: A | Discharge status: Home / Self Care | Payer: BC Managed Care – PPO | Source: Ambulatory Visit | Attending: Obstetrics & Gynecology | Admitting: Obstetrics & Gynecology

## 2022-09-30 DIAGNOSIS — Z0000 Encounter for general adult medical examination without abnormal findings: Secondary | ICD-10-CM

## 2022-09-30 DIAGNOSIS — Z1231 Encounter for screening mammogram for malignant neoplasm of breast: Secondary | ICD-10-CM | POA: Diagnosis not present

## 2022-10-05 ENCOUNTER — Ambulatory Visit (INDEPENDENT_AMBULATORY_CARE_PROVIDER_SITE_OTHER): Payer: BC Managed Care – PPO | Admitting: Sports Medicine

## 2022-10-05 ENCOUNTER — Encounter: Payer: Self-pay | Admitting: Sports Medicine

## 2022-10-05 VITALS — BP 98/68 | HR 72 | Ht 65.5 in | Wt 140.0 lb

## 2022-10-05 DIAGNOSIS — M722 Plantar fascial fibromatosis: Secondary | ICD-10-CM | POA: Diagnosis not present

## 2022-10-05 MED ORDER — MELOXICAM 15 MG PO TABS: ORAL_TABLET | ORAL | 0 refills | Status: AC

## 2022-10-05 NOTE — Progress Notes (Signed)
PCP: Lewis Moccasin, MD  Subjective:   HPI: Patient is a 52 y.o. female here for bilateral heel pain, right greater than left.  -Ongoing x 1 month, worse in the mornings when she wakes up and gets out of bed -Pain is generally located over the medial aspect of her heel but when it flares up will usually be more diffuse throughout the heel -She is a runner and occasionally takes group fitness classes and walks her dog regularly.  Pain is generally worse with exercise -Denies any traumatic injury to her feet -History of bunion on left foot but no history of plantar fasciitis -Has not been trying much to help with pain aside from rest.  Not using NSAIDs regularly.  Has been trying some stretches   No past medical history on file.  Current Outpatient Medications on File Prior to Visit  Medication Sig Dispense Refill   ALPRAZolam (XANAX) 0.5 MG tablet   1   AMBULATORY NON FORMULARY MEDICATION Dexamethasone sodium phosphate .04% solution for iontophoresis 30 mL 0   diclofenac (VOLTAREN) 75 MG EC tablet Take 1 tablet (75 mg total) by mouth 2 (two) times daily. 30 tablet 2   zolpidem (AMBIEN) 10 MG tablet   2   No current facility-administered medications on file prior to visit.    Past Surgical History:  Procedure Laterality Date   BREAST BIOPSY Left 09/25/2018    Allergies  Allergen Reactions   Erythromycin Hives, Itching and Rash    There were no vitals taken for this visit.     10/27/2020    2:08 PM  [facility] Adult Exercise  Frequency of aerobic exercise (# of days/week) 6  Average time in minutes 60  Frequency of strengthening activities (# of days/week) 3        No data to display              Objective:  Physical Exam:  Gen: NAD, comfortable in exam room  B/L feet: Inspection: No gross deformity, ecchymosis, swelling.  Pes cavus noted Palpation: Tenderness to palpation over the medial aspect of bilateral heels, right worse than left.   Otherwise nontender throughout bilateral calves, Achilles, ankles, feet ROM: Normal range of motion of ankle and toes Strength: 5/5 strength Special tests: Calcaneal squeeze with very mild pain on the right  Limited ultrasound of right heel completed and showed normal thickness of plantar fascia with no identifiable bone spurs.  However, soft tissue edema noted deep to the plantar fascia   Assessment & Plan:  1.  Heel pain 2/2 plantar fasciitis, right worse than left: Recommend home exercises as well as meloxicam daily to help reduce inflammation.  Encouraged patient to look into [place] sock.  Follow-up in 3 weeks, likely trial orthotics if no improvement given her high arches  Patient seen and evaluated with the resident.  I agree with the above plan of care.  Treatment for Planter fasciitis as above.  If symptoms persist, we discussed custom orthotics.  She has had custom orthotics made previously in our office several years ago for an unrelated issue.  This note was dictated using Dragon naturally speaking software and may contain errors in syntax, spelling, or content which have not been identified prior to signing this note.

## 2022-11-15 ENCOUNTER — Ambulatory Visit (INDEPENDENT_AMBULATORY_CARE_PROVIDER_SITE_OTHER): Payer: BC Managed Care – PPO | Admitting: Sports Medicine

## 2022-11-15 VITALS — BP 110/74 | Ht 65.5 in | Wt 143.0 lb

## 2022-11-15 DIAGNOSIS — Z01419 Encounter for gynecological examination (general) (routine) without abnormal findings: Secondary | ICD-10-CM | POA: Diagnosis not present

## 2022-11-15 DIAGNOSIS — Z823 Family history of stroke: Secondary | ICD-10-CM | POA: Diagnosis not present

## 2022-11-15 DIAGNOSIS — M722 Plantar fascial fibromatosis: Secondary | ICD-10-CM | POA: Diagnosis not present

## 2022-11-15 DIAGNOSIS — Z1151 Encounter for screening for human papillomavirus (HPV): Secondary | ICD-10-CM | POA: Diagnosis not present

## 2022-11-15 DIAGNOSIS — Z124 Encounter for screening for malignant neoplasm of cervix: Secondary | ICD-10-CM | POA: Diagnosis not present

## 2022-11-15 NOTE — Progress Notes (Signed)
Patient ID: Bonnie Perez, female   DOB: [birth year], 52 y.o.   MRN: [id number]  [name] presents today for custom orthotics.  She continues to have pain from plantar fasciitis.  Custom orthotics were created as below.  Gait was neutral with orthotics in place.  We did discuss a trial of soundwave therapy for her plantar fasciitis if she continues to have pain.  Follow-up as needed.  Patient was fitted for a : standard, cushioned, semi-rigid orthotic. The orthotic was heated and afterward the patient stood on the orthotic blank positioned on the orthotic stand. The patient was positioned in subtalar neutral position and 10 degrees of ankle dorsiflexion in a weight bearing stance. After completion of molding, a stable base was applied to the orthotic blank. The blank was ground to a stable position for weight bearing. Size: 6 Base: Blue EVA Posting: None Additional orthotic padding: None  This note was dictated using Dragon naturally speaking software and may contain errors in syntax, spelling, or content which have not been identified prior to signing this note.

## 2022-12-16 ENCOUNTER — Ambulatory Visit: Payer: BC Managed Care – PPO | Admitting: Sports Medicine

## 2022-12-21 ENCOUNTER — Ambulatory Visit (INDEPENDENT_AMBULATORY_CARE_PROVIDER_SITE_OTHER): Payer: Self-pay | Admitting: Sports Medicine

## 2022-12-21 DIAGNOSIS — M722 Plantar fascial fibromatosis: Secondary | ICD-10-CM

## 2022-12-21 NOTE — Progress Notes (Unsigned)
PCP: Lewis Moccasin, MD  SUBJECTIVE:   HPI:  Patient is a 52 y.o. female here for ECSWT of right sided Plantar Fasciitis. She was initially evaluated back in July and found to have bilateral Plantar Fasciits, R>L. She has since been doing her stretches and icing. She had orthotics made in August and notes these and her Oofos have been helpful, but the pain on the right is persisting. She states she hasn't been diligent in doing her exercises.  ROS:     See HPI  PERTINENT  PMH / PSH FH / / SH:  Past Medical, Surgical, Social, and Family History Reviewed & Updated in the EMR.  Pertinent findings include:  Non-contributory  Allergies  Allergen Reactions   Erythromycin Hives, Itching and Rash    OBJECTIVE:  There were no vitals taken for this visit.  PHYSICAL EXAM:  GEN: Alert and Oriented, NAD, comfortable in exam room RESP: Unlabored respirations, symmetric chest rise PSY: normal mood, congruent affect   RIGHT FOOT MSK EXAM No deformity, ecchymosis, or swelling. She does have pes cavus at rest. TTP at medial attachment of PF on calcaneus. Negative calcaneal squeeze.  Negative Windlass test.    ASSESSMENT & PLAN:   Procedure: ECSWT Indications:  Plantar fasciitis, right   Procedure Details Consent: Risks of procedure as well as the alternatives and risks of each were explained to the patient.  Written consent for procedure obtained. Time Out: Verified patient identification, verified procedure, site was marked, verified correct patient position, medications/allergies/relevent history reviewed.  The area was cleaned with alcohol swab.     The right plantar fascia was targeted for Extracorporeal shockwave therapy.    Preset: Plantar Fasciitis Power Level: 90mJ Frequency: 12Hz  Impulse/cycles: 2000 Head size: Large   Patient tolerated procedure well without immediate complications. Reviewed that I anticipate this will take at least 3-4 sessions to note marked  improvement. Advised f/u in 1 week. Encouraged continuation of calfs tretches.     Glean Salen, MD PGY-4, Sports Medicine Fellow [facility]  Addendum:  Patient seen in the office by fellow.  His history, exam, plan of care were precepted with me.  Norton Blizzard MD Marrianne Mood

## 2022-12-27 ENCOUNTER — Ambulatory Visit (INDEPENDENT_AMBULATORY_CARE_PROVIDER_SITE_OTHER): Payer: Self-pay | Admitting: Family Medicine

## 2022-12-27 DIAGNOSIS — M722 Plantar fascial fibromatosis: Secondary | ICD-10-CM

## 2022-12-27 NOTE — Progress Notes (Signed)
Procedure: ECSWT Indications:  Plantar fasciitis right foot   Patient notes minimal improvement with previous shockwave therapy.  Patient does have upcoming trip to [place] this weekend though that may be delayed due to the weather.  Will attempt 1 more therapy to see if any further improvement  Procedure Details Consent: Risks of procedure as well as the alternatives and risks of each were explained to the patient.  Verbal consent for procedure obtained. Time Out: Verified patient identification, verified procedure, site was marked, verified correct patient position. The area was cleaned with alcohol swab.     The right plantar fascia was targeted for Extracorporeal shockwave therapy.    Preset: Plantar fasciitis  Power Level: Frequency: 12Hz  Impulse/cycles: 2500 Head size: Large  Increased power as well as impulses, we will see if patient has benefit from higher power shockwave.  Advised patient to follow-up in 1 week for reevaluation.  If patient has no benefit at that time can consider alternative therapies.   Patient tolerated procedure well without immediate complications.    Addendum:  Patient seen in the office by fellow.  History, exam, plan of care were precepted with me.  Darene Lamer, DO, CAQSM

## 2023-01-04 ENCOUNTER — Ambulatory Visit (INDEPENDENT_AMBULATORY_CARE_PROVIDER_SITE_OTHER): Payer: Self-pay | Admitting: Sports Medicine

## 2023-01-04 DIAGNOSIS — Z0000 Encounter for general adult medical examination without abnormal findings: Secondary | ICD-10-CM | POA: Diagnosis not present

## 2023-01-04 DIAGNOSIS — Z23 Encounter for immunization: Secondary | ICD-10-CM | POA: Diagnosis not present

## 2023-01-04 DIAGNOSIS — M722 Plantar fascial fibromatosis: Secondary | ICD-10-CM

## 2023-01-04 NOTE — Progress Notes (Signed)
   PCP: Lewis Moccasin, MD  SUBJECTIVE:   HPI:  Patient is a 52 y.o. female here for follow-up ECSWT of right plantar fasciitis. She has had 2 previous sessions. She is pleased with her progress and notes it is >50% better. She was able to walk >30,000 steps at [facility] daily for 3 days last week without much trouble. Has been in a new pair of Oofos which have also helped significantly with her discomfort.  ROS:     See HPI  PERTINENT  PMH / PSH FH / / SH:  Past Medical, Surgical, Social, and Family History Reviewed & Updated in the EMR.  Pertinent findings include:  Non-contributory  Allergies  Allergen Reactions   Erythromycin Hives, Itching and Rash    ASSESSMENT & PLAN:  Procedure: ECSWT, session 3 Indications:  Plantar fasciitis, right   Procedure Details Consent: Risks of procedure as well as the alternatives and risks of each were explained to the patient.  Written consent for procedure obtained. Time Out: Verified patient identification, verified procedure, site was marked, verified correct patient position, medications/allergies/relevent history reviewed.  The area was cleaned with alcohol swab.     The right plantar fascia was targeted for Extracorporeal shockwave therapy.    Preset: Plantar Fasciitis Power Level: Frequency: 12Hz  Impulse/cycles: 2000 Head size: Large   Patient tolerated procedure well without immediate complications. Reviewed with her progress we can continue sessions each week as needed until she notices a plateau of benefit. She is in agreement with this plan.   Glean Salen, MD PGY-4, Sports Medicine Fellow [facility]  Addendum:  Patient seen in the office by fellow.  His history, exam, plan of care were precepted with me.  Norton Blizzard MD Marrianne Mood

## 2023-01-05 ENCOUNTER — Other Ambulatory Visit: Payer: Self-pay | Admitting: Internal Medicine

## 2023-01-05 DIAGNOSIS — Z87312 Personal history of (healed) stress fracture: Secondary | ICD-10-CM

## 2023-01-12 ENCOUNTER — Ambulatory Visit (INDEPENDENT_AMBULATORY_CARE_PROVIDER_SITE_OTHER): Payer: Self-pay | Admitting: Family Medicine

## 2023-01-12 DIAGNOSIS — M722 Plantar fascial fibromatosis: Secondary | ICD-10-CM

## 2023-01-12 NOTE — Progress Notes (Signed)
FOLLOW-UP VISIT:  Shockwave Therapy Procedure   NAME:  Bonnie Perez DOB:  1970/08/06 MR#: [id number] DATE OF VISIT:  01/12/2023  Encounter: Bonnie Perez comes in for a follow up evaluation and her  4th Radial Extracorporeal Shockwave therapy (ESWT) treatment to the right plantar fascia for plantar fasciitis. [name] reports not much interval change in symptoms since last treatment. Mobic has been helpful in the past.  Tried to run Tues - able to run 3-miles, but was painful.  She  denies any fevers, chills, recent illness or infections.  Assessment & Plan Plantar fasciitis, right Plan: ESWT Procedure Note: 4th session  Diagnosis: Rt plantar fasciitis   Procedure Details  Consent for the procedure was obtained. Time out was performed. The anatomical landmarks and target areas for the procedure were identified.   PROCEDURE: ESWT was discussed with the patient in detail.  The risk and benefits were explained.  The point of maximal tenderness was identified and the oscillator probe was applied with moderate pressure.   Preset: Plantar Fasciitis Power Level: Frequency: 12Hz  Impulse/cycles: 2000 Head size: Large   Patient tolerated procedure well without immediate complications. Reviewed with her progress we can continue sessions each week as needed until she notices a plateau of benefit. She is in agreement with this plan.   Complications:  None; patient tolerated the procedure well.  - Procedure aftercare reviewed - F/u 1 week for 5th ESWT procedure - will assess program and further need for treatment at that time - Recommended avoiding strenuous activities and using OTC analgesia prn.

## 2023-01-12 NOTE — Patient Instructions (Signed)

## 2023-01-18 ENCOUNTER — Ambulatory Visit (INDEPENDENT_AMBULATORY_CARE_PROVIDER_SITE_OTHER): Payer: Self-pay | Admitting: Sports Medicine

## 2023-01-18 DIAGNOSIS — M722 Plantar fascial fibromatosis: Secondary | ICD-10-CM

## 2023-01-18 NOTE — Patient Instructions (Signed)

## 2023-01-18 NOTE — Progress Notes (Unsigned)
   PCP: Lewis Moccasin, MD  SUBJECTIVE:   HPI:  Patient is a 52 y.o. female here for follow-up ECSWT. She is continuing to note improvement with her treatments. Feels she is about 65% better.   ROS:     See HPI  PERTINENT  PMH / PSH FH / / SH:  Past Medical, Surgical, Social, and Family History Reviewed & Updated in the EMR.  Pertinent findings include:  Non-contributory    ASSESSMENT & PLAN:  Procedure: ECSWT, session 5 Indications:  Plantar fasciitis, right   Procedure Details Consent: Risks of procedure as well as the alternatives and risks of each were explained to the patient.  Written consent for procedure obtained. Time Out: Verified patient identification, verified procedure, site was marked, verified correct patient position, medications/allergies/relevent history reviewed.  The area was cleaned with alcohol swab.     The right plantar fascia was targeted for Extracorporeal shockwave therapy.    Preset: Plantar Fasciitis Power Level: Frequency: 12Hz  Impulse/cycles: 2500 Head size: Large   Patient tolerated procedure well without immediate complications. Recommended one additional scheduled session and if still improving may go week-to-week thereafter. She is in agreement with this plan.    Glean Salen, MD PGY-4, Sports Medicine Fellow [facility]  Addendum:  I was the preceptor for this visit and available for immediate consultation.  Norton Blizzard MD Marrianne Mood

## 2023-01-25 ENCOUNTER — Ambulatory Visit (INDEPENDENT_AMBULATORY_CARE_PROVIDER_SITE_OTHER): Payer: Self-pay | Admitting: Sports Medicine

## 2023-01-25 DIAGNOSIS — M722 Plantar fascial fibromatosis: Secondary | ICD-10-CM

## 2023-01-25 NOTE — Patient Instructions (Signed)
    Today you had ESWT (extracorporeal shock wave therapy).  This is a procedure that sends sound waves into your body to break up calcifications, scar tissue, and helps to stimulate healing processes.   ESWT is considered very safe with the major side effects being mild pain and sometimes bruising.  Many patients experience some benefit in the first 24 hours with better movement.  However, the benefits occur gradually over 5 to 7 days.  For most conditions ESWT requires at least 4 to 6 sessions and sometimes prolonged treatment administered once weekly.   Most insurances do not cover ESWT as it is a relatively new treatment for many conditions.  We try to keep the charge similar to a co-pay so as to lessen your out-of-pocket costs.   If you have more severe or progressive pain, fever, swelling or skin breakdown after a treatment you should call our office.

## 2023-01-25 NOTE — Progress Notes (Unsigned)
    PCP: Geoffry Paradise, MD    HPI:  Patient is a 52 y.o. female here for ECSWT for right plantar fasciitis. She notes a stalling of improvement over the previous three sessions. Still about 65% better overall. She has also started to note some similar symptoms in her left foot as well. She continues with her home therapies and wearing shoes with good support. She inquires about seeing formal PT as she has previously been referred to [facility] though hasn't done this yet.   ASSESSMENT & PLAN:  1. Right Plantar Fasciitis Pt would like to try another round of ECSWT. Given she has stalled some in her improvement I recommended we try again today, but if she doesn't make progress we may consider an US guided cortisone injection in 2 weeks with a holiday from shockwave therapy. I also recommended she formally consult PT at this point which she is amenable to.   Procedure: ECSWT, session 6 Indications:  Right Plantar Fasciitis   Procedure Details Consent: Risks of procedure as well as the alternatives and risks of each were explained to the patient.  Written consent for procedure obtained. Time Out: Verified patient identification, verified procedure, site was marked, verified correct patient position, medications/allergies/relevent history reviewed.  The area was cleaned with alcohol swab.     The left proximal hamstring was targeted for extracorporeal shockwave therapy.    Preset: Chronic calcific tendinitis Power Level: 120 mJ Frequency: 12 Hz Impulse/cycles: 2500 Head size: large   Patient tolerated procedure well without immediate complications.       Glean Salen, MD PGY-4, Sports Medicine Fellow [facility]  Addendum:  I was the preceptor for this visit and available for immediate consultation.  Norton Blizzard MD Marrianne Mood

## 2023-01-27 ENCOUNTER — Other Ambulatory Visit: Payer: Self-pay | Admitting: Podiatry

## 2023-01-27 DIAGNOSIS — L900 Lichen sclerosus et atrophicus: Secondary | ICD-10-CM | POA: Diagnosis not present

## 2023-01-27 MED ORDER — CIPROFLOXACIN HCL 500 MG PO TABS: 500.0000 mg | ORAL_TABLET | Freq: Two times a day (BID) | ORAL | 0 refills | Status: AC

## 2023-05-15 DIAGNOSIS — L821 Other seborrheic keratosis: Secondary | ICD-10-CM | POA: Diagnosis not present

## 2023-05-15 DIAGNOSIS — D2261 Melanocytic nevi of right upper limb, including shoulder: Secondary | ICD-10-CM | POA: Diagnosis not present

## 2023-05-15 DIAGNOSIS — D225 Melanocytic nevi of trunk: Secondary | ICD-10-CM | POA: Diagnosis not present

## 2023-05-15 DIAGNOSIS — L308 Other specified dermatitis: Secondary | ICD-10-CM | POA: Diagnosis not present

## 2023-05-18 DIAGNOSIS — Z8709 Personal history of other diseases of the respiratory system: Secondary | ICD-10-CM | POA: Diagnosis not present

## 2023-05-18 DIAGNOSIS — R058 Other specified cough: Secondary | ICD-10-CM | POA: Diagnosis not present

## 2023-06-02 DIAGNOSIS — R059 Cough, unspecified: Secondary | ICD-10-CM | POA: Diagnosis not present

## 2023-06-16 DIAGNOSIS — L900 Lichen sclerosus et atrophicus: Secondary | ICD-10-CM | POA: Diagnosis not present

## 2023-07-07 ENCOUNTER — Other Ambulatory Visit (HOSPITAL_COMMUNITY): Payer: Self-pay | Admitting: Radiology

## 2023-07-07 DIAGNOSIS — R053 Chronic cough: Secondary | ICD-10-CM

## 2023-07-19 ENCOUNTER — Ambulatory Visit (HOSPITAL_COMMUNITY)
Admission: RE | Admit: 2023-07-19 | Discharge: 2023-07-19 | Disposition: A | Discharge status: Home / Self Care | Source: Ambulatory Visit | Attending: Internal Medicine | Admitting: Internal Medicine

## 2023-07-19 DIAGNOSIS — R053 Chronic cough: Secondary | ICD-10-CM | POA: Diagnosis not present

## 2023-07-19 LAB — PULMONARY FUNCTION TEST
DL/VA % pred: 105 %
DL/VA: 4.51 ml/min/mmHg/L
DLCO unc % pred: 105 %
DLCO unc: 22.84 ml/min/mmHg
FEF 25-75 Post: 2.88 L/s
FEF 25-75 Pre: 1.93 L/s
FEF2575-%Change-Post: 49 %
FEF2575-%Pred-Post: 105 %
FEF2575-%Pred-Pre: 70 %
FEV1-%Change-Post: 10 %
FEV1-%Pred-Post: 90 %
FEV1-%Pred-Pre: 81 %
FEV1-Post: 2.58 L
FEV1-Pre: 2.33 L
FEV1FVC-%Change-Post: 3 %
FEV1FVC-%Pred-Pre: 94 %
FEV6-%Change-Post: 7 %
FEV6-%Pred-Post: 93 %
FEV6-%Pred-Pre: 86 %
FEV6-Post: 3.3 L
FEV6-Pre: 3.05 L
FEV6FVC-%Pred-Post: 102 %
FEV6FVC-%Pred-Pre: 102 %
FVC-%Change-Post: 6 %
FVC-%Pred-Post: 90 %
FVC-%Pred-Pre: 85 %
FVC-Post: 3.3 L
FVC-Pre: 3.09 L
Post FEV1/FVC ratio: 78 %
Post FEV6/FVC ratio: 100 %
Pre FEV1/FVC ratio: 76 %
Pre FEV6/FVC Ratio: 100 %
RV % pred: 103 %
RV: 1.95 L
TLC % pred: 100 %
TLC: 5.25 L

## 2023-07-19 MED ORDER — ALBUTEROL SULFATE (2.5 MG/3ML) 0.083% IN NEBU
2.5000 mg | INHALATION_SOLUTION | Freq: Once | RESPIRATORY_TRACT | Status: AC
  Administered 2023-07-19: 2.5 mg via RESPIRATORY_TRACT

## 2023-08-01 DIAGNOSIS — H43813 Vitreous degeneration, bilateral: Secondary | ICD-10-CM | POA: Diagnosis not present

## 2023-08-01 DIAGNOSIS — R053 Chronic cough: Secondary | ICD-10-CM | POA: Diagnosis not present

## 2023-08-07 ENCOUNTER — Ambulatory Visit
Admission: RE | Admit: 2023-08-07 | Discharge: 2023-08-07 | Disposition: A | Discharge status: Home / Self Care | Payer: BC Managed Care – PPO | Source: Ambulatory Visit | Attending: Internal Medicine | Admitting: Internal Medicine

## 2023-08-07 DIAGNOSIS — M8588 Other specified disorders of bone density and structure, other site: Secondary | ICD-10-CM | POA: Diagnosis not present

## 2023-08-07 DIAGNOSIS — N958 Other specified menopausal and perimenopausal disorders: Secondary | ICD-10-CM | POA: Diagnosis not present

## 2023-08-07 DIAGNOSIS — Z87312 Personal history of (healed) stress fracture: Secondary | ICD-10-CM

## 2023-08-23 ENCOUNTER — Other Ambulatory Visit: Payer: Self-pay | Admitting: Obstetrics & Gynecology

## 2023-08-23 DIAGNOSIS — Z1231 Encounter for screening mammogram for malignant neoplasm of breast: Secondary | ICD-10-CM

## 2023-10-03 ENCOUNTER — Ambulatory Visit
Admission: RE | Admit: 2023-10-03 | Discharge: 2023-10-03 | Disposition: A | Discharge status: Home / Self Care | Source: Ambulatory Visit | Attending: Obstetrics & Gynecology | Admitting: Obstetrics & Gynecology

## 2023-10-03 DIAGNOSIS — Z1231 Encounter for screening mammogram for malignant neoplasm of breast: Secondary | ICD-10-CM

## 2023-10-05 ENCOUNTER — Encounter (INDEPENDENT_AMBULATORY_CARE_PROVIDER_SITE_OTHER): Payer: Self-pay | Admitting: Otolaryngology

## 2023-10-05 ENCOUNTER — Ambulatory Visit (INDEPENDENT_AMBULATORY_CARE_PROVIDER_SITE_OTHER): Admitting: Otolaryngology

## 2023-10-05 VITALS — BP 119/83 | HR 67 | Ht 65.5 in | Wt 141.0 lb

## 2023-10-05 DIAGNOSIS — K219 Gastro-esophageal reflux disease without esophagitis: Secondary | ICD-10-CM | POA: Diagnosis not present

## 2023-10-05 DIAGNOSIS — R053 Chronic cough: Secondary | ICD-10-CM | POA: Diagnosis not present

## 2023-10-05 NOTE — Progress Notes (Signed)
 Dear Dr. Dwight, Here is my assessment for our mutual patient, Bonnie Perez. Thank you for allowing me the opportunity to care for your patient. Please do not hesitate to contact me should you have any other questions. Sincerely, Dr. Eldora Blanch  Otolaryngology Clinic Note Referring provider: Dr. Dwight HPI:  Bonnie Perez is a 53 y.o. female kindly referred by Dr. Dwight for evaluation of cough  Initial visit (09/2023): Patient reports: noted influenza, symptomatic in Feb. Since then, everything else improved but the cough persisted. Predominantly dry, but felt like she needed to clear something. She tried prednisone, flonase, GERD medication. GERD meds/PPI helped some, and then she ran out so stopped taking it. She has also had a pulmonary function tests and CXR which was essentially without pathology. Currently, doing much better, but still has occasional cough. Certainly not significantly bothersome. She does clear her throat a fair amount and feels like there is gunk back there. No recent intubations. Throat does not feel dry. Does not drink coffee or carbonated beverages. She does report some reflux - symptoms were sometimes related to after eating. Patient otherwise denies: - dysphagia, odynophagia, aspiration episodes or PNA, unintentional weight loss - changes in voice, shortness of breath, hemoptysis - ear pain, neck masses  ENT Surgery: no Personal or FHx of bleeding dz or anesthesia difficulty: no  Tobacco: no.  PMHx: Lichen Sclerosis, HLD  Independent Review of Additional Tests or Records:  Dr. Dwight Referral notes reviewed and uploaded or available in chart in media tab ( 08/01/2023): chronic cough after having flu in 04/2023. Concern for LPR; CXR and PFT normal, trial flonase and anthistamine performed; Dx: chronic cough; Rx: ref to ENT CBC and CMP 11/28/2022: WBC 6.6, Hgb 13.4; BUN/Cr 18/0.7 PFTs 07/19/2023 reviewed: minimal airway obstruction but generally wnl; FEV1% 81;  FEV1/FVC75  PMH/Meds/All/SocHx/FamHx/ROS:  History reviewed. No pertinent past medical history.   Past Surgical History:  Procedure Laterality Date   BREAST BIOPSY Left 09/25/2018    Family History  Problem Relation Age of Onset   Breast cancer Maternal Aunt      Social Connections: Not on file      Current Outpatient Medications:    ALPRAZolam (XANAX) 0.5 MG tablet, , Disp: , Rfl: 1   zolpidem (AMBIEN) 10 MG tablet, , Disp: , Rfl: 2   AMBULATORY NON FORMULARY MEDICATION, Dexamethasone sodium phosphate .04% solution for iontophoresis (Patient not taking: Reported on 10/05/2023), Disp: 30 mL, Rfl: 0   diclofenac  (VOLTAREN ) 75 MG EC tablet, Take 1 tablet (75 mg total) by mouth 2 (two) times daily. (Patient not taking: Reported on 10/05/2023), Disp: 30 tablet, Rfl: 2   meloxicam  (MOBIC ) 15 MG tablet, Take 1 by mouth daily with food for 5 days then as needed. (Patient not taking: Reported on 10/05/2023), Disp: 40 tablet, Rfl: 0   Physical Exam:   BP 119/83 (BP Location: Left Arm, Patient Position: Sitting, Cuff Size: Normal)   Pulse 67   Ht 5' 5.5 (1.664 m)   Wt 141 lb (64 kg)   SpO2 98%   BMI 23.11 kg/m   Salient findings:  CN II-XII intact Bilateral EAC clear and TM intact with well pneumatized middle ear spaces Anterior rhinoscopy: Septum intact, relatively midline; bilateral inferior turbinates without significant hypertrophy No lesions of oral cavity/oropharynx; dentition good No obviously palpable neck masses/lymphadenopathy/thyromegaly No respiratory distress or stridor; no significant coughing today; voice quality class 1; TFL was indicated to better evaluate the proximal airway, given the patient's history and exam findings,  and is detailed below.  Seprately Identifiable Procedures:  Prior to initiating any procedures, risks/benefits/alternatives were explained to the patient and verbal consent obtained. Procedure Note Pre-procedure diagnosis: Chronic cough, GERD +  LPR Post-procedure diagnosis: Same Procedure: Transnasal Fiberoptic Laryngoscopy, CPT 31575 - Mod 25 Indication: see above Complications: None apparent EBL: 0 mL  The procedure was undertaken to further evaluate the patient's complaint above, with mirror exam inadequate for appropriate examination due to gag reflex and poor patient tolerance  Procedure:  Patient was identified as correct patient. Verbal consent was obtained. The nose was sprayed with oxymetazoline and 4% lidocaine . The The flexible laryngoscope was passed through the nose to view the nasal cavity, pharynx (oropharynx, hypopharynx) and larynx.  The larynx was examined at rest and during multiple phonatory tasks. Documentation was obtained and reviewed with patient. The scope was removed. The patient tolerated the procedure well.  Findings: The nasal cavity and nasopharynx did not reveal any masses or lesions, mucosa appeared to be without obvious lesions. The tongue base, pharyngeal walls, piriform sinuses, vallecula, epiglottis and postcricoid region are normal in appearance without retained pyriform secretions or significant post-cricoid redundant mucosa. The visualized portion of the subglottis and proximal trachea is widely patent. The vocal folds are mobile bilaterally. There are no lesions on the free edge of the vocal folds nor elsewhere in the larynx worrisome for malignancy.      Electronically signed by: Eldora KATHEE Blanch, MD 10/05/2023 10:57 AM   Impression & Plans:  Bonnie Perez is a 52 y.o. female with;  1. Chronic cough   2. Laryngopharyngeal reflux (LPR)   3. Gastroesophageal reflux disease, unspecified whether esophagitis present    Noted chronic cough after influenza this past winter. Slowly improving. TFL reassuring. Suspect post-viral cough. In addition, she does have some sx suggestive of LPR or GERD. We discussed options and given slow improvement, would recommend observation currently. From LPR/GERD  standpoint: can trial barrier agent such as reflux gourmet PRN We also discussed management of post-viral and chronic cough in the future if it occurs  - f/u PRN, certainly if cough does not continue to improve  See below regarding exact medications prescribed this encounter including dosages and route: No orders of the defined types were placed in this encounter.     Thank you for allowing me the opportunity to care for your patient. Please do not hesitate to contact me should you have any other questions.  Sincerely, Eldora Blanch, MD Otolaryngologist (ENT), [facility] Phone: [phone] Fax: [phone]  10/05/2023, 10:57 AM   MDM:  Level 4 - [phone] Complexity/Problems addressed: mod - chronic problems Data complexity: mod - independent review of notes, labs, test - Morbidity: low  - Prescription Drug prescribed or managed: no

## 2023-10-07 ENCOUNTER — Other Ambulatory Visit: Payer: Self-pay | Admitting: Medical Genetics

## 2023-11-23 ENCOUNTER — Other Ambulatory Visit

## 2023-11-23 DIAGNOSIS — Z006 Encounter for examination for normal comparison and control in clinical research program: Secondary | ICD-10-CM

## 2023-11-27 DIAGNOSIS — Z6823 Body mass index (BMI) 23.0-23.9, adult: Secondary | ICD-10-CM | POA: Diagnosis not present

## 2023-11-27 DIAGNOSIS — Z01419 Encounter for gynecological examination (general) (routine) without abnormal findings: Secondary | ICD-10-CM | POA: Diagnosis not present

## 2023-12-01 LAB — GENECONNECT MOLECULAR SCREEN: Genetic Analysis Overall Interpretation: NEGATIVE

## 2024-01-05 DIAGNOSIS — E7800 Pure hypercholesterolemia, unspecified: Secondary | ICD-10-CM | POA: Diagnosis not present

## 2024-01-05 DIAGNOSIS — E559 Vitamin D deficiency, unspecified: Secondary | ICD-10-CM | POA: Diagnosis not present

## 2024-01-05 DIAGNOSIS — Z0000 Encounter for general adult medical examination without abnormal findings: Secondary | ICD-10-CM | POA: Diagnosis not present

## 2024-01-05 DIAGNOSIS — Z79899 Other long term (current) drug therapy: Secondary | ICD-10-CM | POA: Diagnosis not present

## 2024-01-05 DIAGNOSIS — R7303 Prediabetes: Secondary | ICD-10-CM | POA: Diagnosis not present

## 2024-01-10 DIAGNOSIS — E7800 Pure hypercholesterolemia, unspecified: Secondary | ICD-10-CM | POA: Diagnosis not present

## 2024-01-10 DIAGNOSIS — F419 Anxiety disorder, unspecified: Secondary | ICD-10-CM | POA: Diagnosis not present

## 2024-01-10 DIAGNOSIS — R7303 Prediabetes: Secondary | ICD-10-CM | POA: Diagnosis not present

## 2024-01-10 DIAGNOSIS — E559 Vitamin D deficiency, unspecified: Secondary | ICD-10-CM | POA: Diagnosis not present

## 2024-01-10 DIAGNOSIS — Z0000 Encounter for general adult medical examination without abnormal findings: Secondary | ICD-10-CM | POA: Diagnosis not present

## 2024-01-10 DIAGNOSIS — Z23 Encounter for immunization: Secondary | ICD-10-CM | POA: Diagnosis not present

## 2024-01-12 DIAGNOSIS — L900 Lichen sclerosus et atrophicus: Secondary | ICD-10-CM | POA: Diagnosis not present
